# Patient Record
Sex: Male | Born: 1965 | Race: Black or African American | Hispanic: No | Smoking: Never smoker
Health system: Southern US, Community
[De-identification: ages and names within clinical notes are randomized; demographics above are authoritative.]

## PROBLEM LIST (undated history)

## (undated) DIAGNOSIS — E119 Type 2 diabetes mellitus without complications: Secondary | ICD-10-CM

## (undated) DIAGNOSIS — I10 Essential (primary) hypertension: Secondary | ICD-10-CM

## (undated) HISTORY — PX: HIP ARTHROPLASTY: SHX981

---

## 2017-07-01 ENCOUNTER — Emergency Department (HOSPITAL_BASED_OUTPATIENT_CLINIC_OR_DEPARTMENT_OTHER): Payer: No Typology Code available for payment source

## 2017-07-01 ENCOUNTER — Encounter (HOSPITAL_BASED_OUTPATIENT_CLINIC_OR_DEPARTMENT_OTHER): Payer: Self-pay | Admitting: *Deleted

## 2017-07-01 ENCOUNTER — Emergency Department (HOSPITAL_BASED_OUTPATIENT_CLINIC_OR_DEPARTMENT_OTHER)
Admission: EM | Admit: 2017-07-01 | Discharge: 2017-07-01 | Disposition: A | Discharge status: Home / Self Care | Payer: No Typology Code available for payment source | Attending: Emergency Medicine | Admitting: Emergency Medicine

## 2017-07-01 DIAGNOSIS — M791 Myalgia: Secondary | ICD-10-CM | POA: Insufficient documentation

## 2017-07-01 DIAGNOSIS — M25512 Pain in left shoulder: Secondary | ICD-10-CM | POA: Diagnosis present

## 2017-07-01 DIAGNOSIS — M7918 Myalgia, other site: Secondary | ICD-10-CM

## 2017-07-01 DIAGNOSIS — I10 Essential (primary) hypertension: Secondary | ICD-10-CM

## 2017-07-01 DIAGNOSIS — E119 Type 2 diabetes mellitus without complications: Secondary | ICD-10-CM | POA: Insufficient documentation

## 2017-07-01 HISTORY — DX: Essential (primary) hypertension: I10

## 2017-07-01 HISTORY — DX: Type 2 diabetes mellitus without complications: E11.9

## 2017-07-01 MED ORDER — IBUPROFEN 800 MG PO TABS: 800.0000 mg | ORAL_TABLET | Freq: Three times a day (TID) | ORAL | 0 refills | Status: DC | PRN

## 2017-07-01 MED ORDER — METHOCARBAMOL 500 MG PO TABS: 500.0000 mg | ORAL_TABLET | Freq: Two times a day (BID) | ORAL | 0 refills | Status: DC

## 2017-07-01 NOTE — Discharge Instructions (Signed)
Take ibuprofen 3 times a day. Do not take other anti-inflammatories at the same time (Advil, Motrin, naproxen, Aleve). You may supplement with Tylenol as needed You may use Robaxin twice a day as needed for muscle pain or stiffness. You will likely have continued muscle soreness of the next several days. Follow up with the primary care doctor in one week if pain persists. You may establish care with current health and wellness, or you may call the 800 number listed in the paperwork for further options. Return to the emergency room if you develop vision changes, vomiting, start dropping things, loss of bowel or bladder control, or any new or worsening symptoms.
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
## 2017-07-01 NOTE — ED Notes (Signed)
Patient's BP is elevated and he stated that the last time he took his med was July.  He run out of BP med and he is waiting for his orange card to come in the mail.  His BP is elevated at this time.  Provider will be notified of this matter.
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
## 2017-12-14 ENCOUNTER — Encounter (HOSPITAL_BASED_OUTPATIENT_CLINIC_OR_DEPARTMENT_OTHER): Payer: Self-pay

## 2017-12-14 ENCOUNTER — Emergency Department (HOSPITAL_BASED_OUTPATIENT_CLINIC_OR_DEPARTMENT_OTHER)
Admission: EM | Admit: 2017-12-14 | Discharge: 2017-12-14 | Disposition: A | Discharge status: Home / Self Care | Payer: Self-pay | Attending: Emergency Medicine | Admitting: Emergency Medicine

## 2017-12-14 ENCOUNTER — Other Ambulatory Visit: Payer: Self-pay

## 2017-12-14 DIAGNOSIS — I10 Essential (primary) hypertension: Secondary | ICD-10-CM | POA: Insufficient documentation

## 2017-12-14 DIAGNOSIS — K2900 Acute gastritis without bleeding: Secondary | ICD-10-CM | POA: Insufficient documentation

## 2017-12-14 DIAGNOSIS — Z79899 Other long term (current) drug therapy: Secondary | ICD-10-CM | POA: Insufficient documentation

## 2017-12-14 DIAGNOSIS — Z96649 Presence of unspecified artificial hip joint: Secondary | ICD-10-CM | POA: Insufficient documentation

## 2017-12-14 DIAGNOSIS — E119 Type 2 diabetes mellitus without complications: Secondary | ICD-10-CM | POA: Insufficient documentation

## 2017-12-14 NOTE — ED Provider Notes (Signed)
[facility] EMERGENCY DEPARTMENT Provider Note   CSN: [id number] Arrival date & time: 12/14/17  1055     History   Chief Complaint Chief Complaint  Patient presents with  . Abdominal Pain    HPI Levi Macias is a 52 y.o. male.  Patient is a 52 year old male presenting with 2 days of abdominal pain and new onset nausea without vomiting or diarrhea.  PMH significant for HTN, pre-diabetes.  Patient endorsing progressive onset of diffuse abdominal pain Saturday with improvement of symptoms going into Sunday.  Patient took Pepto-Bismol with some improvement of symptoms.  Patient left work early due to the pain.  Today he presents without abdominal pain but endorses new onset nausea as of this morning which has now resolved without use of medications.  He denies any history of diarrhea.  Patient denies sick contacts and was concerned he may have a stomach bug.  Patient denies fevers or chills, vomiting, chest pain, shortness of breath, leg edema, diarrhea or constipation, melena or hematochezia.  He is a non-smoker and denies alcohol or illicit drug use.      Past Medical History:  Diagnosis Date  . Diabetes mellitus without complication (HCC)   . Hypertension     There are no active problems to display for this patient.   Past Surgical History:  Procedure Laterality Date  . HIP ARTHROPLASTY         Home Medications    Prior to Admission medications   Medication Sig Start Date End Date Taking? Authorizing Provider  LISINOPRIL PO Take by mouth.   Yes [provider]    Family History No family history on file.  Social History Social History   Tobacco Use  . Smoking status: Never Smoker  . Smokeless tobacco: Never Used  Substance Use Topics  . Alcohol use: Yes    Comment: occ  . Drug use: No     Allergies   Peanut-containing drug products   Review of Systems Review of Systems  Constitutional: Negative for chills, fatigue and fever.    HENT: Negative for ear pain and sore throat.   Eyes: Negative for pain and visual disturbance.  Respiratory: Negative for cough and shortness of breath.   Cardiovascular: Negative for chest pain and palpitations.  Gastrointestinal: Positive for nausea. Negative for abdominal pain, constipation, diarrhea and vomiting.  Genitourinary: Negative for dysuria and hematuria.  Musculoskeletal: Negative for arthralgias and back pain.  Skin: Negative for color change and rash.  Neurological: Negative for seizures, syncope and weakness.  All other systems reviewed and are negative.    Physical Exam Updated Vital Signs BP (!) 171/110 (BP Location: Right Arm)   Pulse 75   Temp 98.6 F (37 C) (Oral)   Resp 18   Ht 5\' 9"  (1.753 m)   Wt 121.6 kg (268 lb)   SpO2 100%   BMI 39.58 kg/m   Physical Exam  Constitutional: He appears well-developed and well-nourished.  HENT:  Head: Normocephalic and atraumatic.  Eyes: Conjunctivae are normal.  Neck: Neck supple.  Cardiovascular: Normal rate and regular rhythm.  No murmur heard. Pulmonary/Chest: Effort normal and breath sounds normal. No respiratory distress.  Abdominal: Soft. Normal appearance and bowel sounds are normal. He exhibits no distension and no mass. There is no hepatosplenomegaly. There is no tenderness. There is no rebound and no guarding. No hernia.  Obese  Musculoskeletal: He exhibits no edema.  Neurological: He is alert.  Skin: Skin is warm and dry. Capillary refill  takes less than 2 seconds.  Psychiatric: He has a normal mood and affect.  Nursing note and vitals reviewed.    ED Treatments / Results  Labs (all labs ordered are listed, but only abnormal results are displayed) Labs Reviewed - No data to display  EKG  EKG Interpretation None       Radiology No results found.  Procedures Procedures (including critical care time)  Medications Ordered in ED Medications - No data to display   Initial Impression /  Assessment and Plan / ED Course  I have reviewed the triage vital signs and the nursing notes.  Pertinent labs & imaging results that were available during my care of the patient were reviewed by me and considered in my medical decision making (see chart for details).    Patient is a 52 year old male presenting with 2 days of abdominal pain and new onset nausea without vomiting or diarrhea.  PMH significant for HTN, pre-diabetes.  Patient presenting with 2 days of diffuse abdominal pain now resolved and new onset nausea without vomiting or diarrhea now resolved.  His main concern was that he had a stomach bug.  Abdominal exam benign the patient is morbidly obese making exam limited.  Patient states nausea resolved without use of medications.  Vital signs stable with exception to hypertension at 153/103.  Patient stated he has persistent elevated blood pressure and is not established with a PCP.  Referred with case management for PCP referral.  Viewed return precautions.  Discharge instructions to continue Pepto-Bismol as needed.  Final Clinical Impressions(s) / ED Diagnoses   Final diagnoses:  Acute gastritis without hemorrhage, unspecified gastritis type    ED Discharge Orders    None       Wendee Beavers, DO 12/14/17 1300    Azalia Bilis, MD 12/14/17 [phone]

## 2017-12-14 NOTE — ED Triage Notes (Signed)
C/o abd pain x 4 days-nausea, denies v/d-NAD-steady gait

## 2017-12-14 NOTE — Discharge Instructions (Signed)
Continue taking Pepto-Bismol for your abdominal symptoms.  Case management will get in touch with you to help you establish with a primary care provider.  It is important that you control your blood pressure to prevent cardiovascular disease.

## 2017-12-14 NOTE — Care Management Note (Signed)
Case Management Note  CM consulted for no pcp and no ins.  Placed 3 options for clinic follow up with pharmacy and financial counseling information on the AVS.  Updated Dr. Patria Maneampos.  No further CM needs noted at this time.
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
